# Patient Record
Sex: Female | Born: 1980 | Race: White | Hispanic: No | Marital: Married | Smoking: Never smoker
Health system: Southern US, Community
[De-identification: ages and names within clinical notes are randomized; demographics above are authoritative.]

## PROBLEM LIST (undated history)

## (undated) DIAGNOSIS — R000 Tachycardia, unspecified: Secondary | ICD-10-CM

## (undated) DIAGNOSIS — I499 Cardiac arrhythmia, unspecified: Secondary | ICD-10-CM

## (undated) HISTORY — PX: CERVICAL CONE BIOPSY: SUR198

## (undated) HISTORY — DX: Tachycardia, unspecified: R00.0

---

## 2011-01-26 ENCOUNTER — Encounter: Payer: Self-pay | Admitting: Cardiovascular Disease

## 2011-02-05 ENCOUNTER — Ambulatory Visit (INDEPENDENT_AMBULATORY_CARE_PROVIDER_SITE_OTHER): Payer: No Typology Code available for payment source | Admitting: Cardiovascular Disease

## 2011-02-05 ENCOUNTER — Encounter: Payer: Self-pay | Admitting: Cardiovascular Disease

## 2011-02-05 VITALS — BP 104/60 | HR 109 | Ht 61.0 in | Wt 123.8 lb

## 2011-02-05 DIAGNOSIS — R000 Tachycardia, unspecified: Secondary | ICD-10-CM

## 2011-02-05 MED ORDER — METOPROLOL TARTRATE 25 MG PO TABS: 25.0000 mg | ORAL_TABLET | Freq: Two times a day (BID) | ORAL | Status: DC | PRN

## 2011-02-05 NOTE — Patient Instructions (Signed)
Start Metoprolol Tartrate 25mg  twice daily. You may want to start with 1/2 tablet, even in just the AM. If unable to take Metoprolol, you can try Bystolic 5mg . Your physician has requested that you have an echocardiogram. Echocardiography is a painless test that uses sound waves to create images of your heart. It provides your doctor with information about the size and shape of your heart and how well your heart's chambers and valves are working. This procedure takes approximately one hour. There are no restrictions for this procedure. Your appt for this procedure will be at [facility]:  Your physician recommends that you schedule a follow-up appointment in:

## 2011-02-05 NOTE — Assessment & Plan Note (Signed)
Etiology of her tachycardia is uncertain. It certainly could have been ongoing for a long period of time as we do not have access to old OB/GYN records at this time. Thyroid is within normal limits.  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 This will likely also be important in light of the fact that she would like to get pregnant.  We did discuss various treatment options including metoprolol tartrate or other beta blockers such as bystolic. She will try low-dose metoprolol tartrate 12.5 mg in the morning to start with possible titration upwards as tolerated. Her blood pressure is borderline low.  My concern with a long-standing tachycardia would be the risk of a tachycardia mediated cardiomyopathy. Beta blockers would not be a good choice during pregnancy and if she was symptomatic, we would have to think of alternatives such as calcium channel blockers.  We will call her with the results of her echo have asked her to contact me in the next several weeks after she has had a chance to try metoprolol.

## 2011-02-05 NOTE — Progress Notes (Signed)
   Patient ID: Mary Blair, female    DOB: January 01, 1981, 30 y.o.   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 She is a patient of Dr. Andrey Spearman.  She reports that in general she feels well and does not have any significant symptoms from her fast heart beat. She is uncertain how long she has had an elevated heart rhythm. She does report periods of tachycardia/fluttering that have been on and off for quite some time though more episodes recently. 2 weeks ago, she had episodes of fluttering that seemed to wax and wane all day. Typically the episodes last less than one minute.  She does workout at the gym and it is not unusual for her to have a heart rate in the 170 range. She does have shortness of breath with climbing stairs. She denies any significant lower extremity edema, cough. She has no trouble sleeping at nighttime on a flat surface with one pillow.  EKG shows sinus tachycardia with rate of 109 beats per minute, T-wave abnormality in lead V3     Review of Systems  Constitutional: Negative.   HENT: Negative.   Eyes: Negative.   Respiratory: Negative.        SOB with exertion.  Cardiovascular: Negative.   Gastrointestinal: Negative.   Musculoskeletal: Negative.   Skin: Negative.   Neurological: Negative.   Hematological: Negative.   Psychiatric/Behavioral: Negative.   All other systems reviewed and are negative.    BP 104/60  Pulse 109  Ht 5\' 1"  (1.549 m)  Wt 123 lb 12.8 oz (56.155 kg)  BMI 23.39 kg/m2   Physical Exam  Nursing note and vitals reviewed. Constitutional: She is oriented to person, place, and time. She appears well-developed and well-nourished.  HENT:  Head: Normocephalic.  Nose: Nose normal.  Mouth/Throat: Oropharynx is clear and moist.  Eyes: Conjunctivae are normal. Pupils are equal, round, and reactive to light.  Neck: Normal range of motion.  Neck supple. No JVD present.  Cardiovascular: Regular rhythm, normal heart sounds and intact distal pulses.  Tachycardia present.  Exam reveals no gallop and no friction rub.   No murmur heard. Pulmonary/Chest: Effort normal and breath sounds normal. No respiratory distress. She has no wheezes. She has no rales. She exhibits no tenderness.  Abdominal: Soft. Bowel sounds are normal. She exhibits no distension. There is no tenderness.  Musculoskeletal: Normal range of motion. She exhibits no edema and no tenderness.  Lymphadenopathy:    She has no cervical adenopathy.  Neurological: She is alert and oriented to person, place, and time. Coordination normal.  Skin: Skin is warm and dry. No rash noted. No erythema.  Psychiatric: She has a normal mood and affect. Her behavior is normal. Judgment and thought content normal.         Assessment and Plan

## 2011-02-12 ENCOUNTER — Ambulatory Visit: Payer: Self-pay | Admitting: Cardiovascular Disease

## 2011-02-12 DIAGNOSIS — R0602 Shortness of breath: Secondary | ICD-10-CM

## 2011-10-12 ENCOUNTER — Encounter: Payer: Self-pay | Admitting: Maternal and Fetal Medicine

## 2012-03-16 ENCOUNTER — Observation Stay: Payer: Self-pay

## 2012-03-16 ENCOUNTER — Inpatient Hospital Stay: Payer: Self-pay | Admitting: Obstetrics and Gynecology

## 2012-03-17 LAB — CBC WITH DIFFERENTIAL/PLATELET
Basophil #: 0 10*3/uL (ref 0.0–0.1)
Eosinophil #: 0 10*3/uL (ref 0.0–0.7)
Eosinophil %: 0 %
HCT: 35.8 % (ref 35.0–47.0)
HGB: 12.3 g/dL (ref 12.0–16.0)
Lymphocyte %: 6.1 %
MCH: 31.4 pg (ref 26.0–34.0)
MCV: 91 fL (ref 80–100)
Monocyte %: 2.7 %
Neutrophil #: 17 10*3/uL — ABNORMAL HIGH (ref 1.4–6.5)
RBC: 3.92 10*6/uL (ref 3.80–5.20)
RDW: 12.7 % (ref 11.5–14.5)
WBC: 18.7 10*3/uL — ABNORMAL HIGH (ref 3.6–11.0)

## 2012-03-18 LAB — HEMATOCRIT: HCT: 30.2 % — ABNORMAL LOW (ref 35.0–47.0)

## 2013-04-21 ENCOUNTER — Emergency Department: Payer: Self-pay | Admitting: Emergency Medicine

## 2013-04-21 LAB — CBC
HCT: 38.5 % (ref 35.0–47.0)
HGB: 13.3 g/dL (ref 12.0–16.0)
MCH: 30.4 pg (ref 26.0–34.0)
MCHC: 34.6 g/dL (ref 32.0–36.0)
MCV: 88 fL (ref 80–100)
Platelet: 274 10*3/uL (ref 150–440)
WBC: 6.7 10*3/uL (ref 3.6–11.0)

## 2013-04-21 LAB — BASIC METABOLIC PANEL
Anion Gap: 5 — ABNORMAL LOW (ref 7–16)
Chloride: 110 mmol/L — ABNORMAL HIGH (ref 98–107)
Co2: 26 mmol/L (ref 21–32)
Creatinine: 0.68 mg/dL (ref 0.60–1.30)
Sodium: 141 mmol/L (ref 136–145)

## 2013-04-21 LAB — TSH: Thyroid Stimulating Horm: 1.13 u[IU]/mL

## 2013-04-21 LAB — TROPONIN I: Troponin-I: <0.02 ng/mL

## 2013-05-29 ENCOUNTER — Encounter: Payer: Self-pay | Admitting: Cardiovascular Disease

## 2013-05-29 ENCOUNTER — Ambulatory Visit (INDEPENDENT_AMBULATORY_CARE_PROVIDER_SITE_OTHER): Payer: 59 | Admitting: Cardiovascular Disease

## 2013-05-29 VITALS — BP 118/80 | HR 96 | Ht 61.0 in | Wt 124.2 lb

## 2013-05-29 DIAGNOSIS — R000 Tachycardia, unspecified: Secondary | ICD-10-CM

## 2013-05-29 DIAGNOSIS — R079 Chest pain, unspecified: Secondary | ICD-10-CM

## 2013-05-29 DIAGNOSIS — R002 Palpitations: Secondary | ICD-10-CM

## 2013-05-29 MED ORDER — METOPROLOL TARTRATE 25 MG PO TABS: 25.0000 mg | ORAL_TABLET | Freq: Two times a day (BID) | ORAL | Status: AC | PRN

## 2013-05-29 MED ORDER — PROPRANOLOL HCL 10 MG PO TABS: 10.0000 mg | ORAL_TABLET | Freq: Three times a day (TID) | ORAL | Status: AC | PRN

## 2013-05-29 NOTE — Assessment & Plan Note (Signed)
Have asked her to monitor her heart rate on a pulse meter on her phone. She may have short runs of atrial tachycardia or SVT. Symptoms are very short period she will continue on low-dose metoprolol twice a day. Could try propranolol when necessary for breakthrough palpitations

## 2013-05-29 NOTE — Assessment & Plan Note (Signed)
Recent episode of tachycardia with chest pain. EKG was normal, cardiac enzymes normal x1, normal TSH. No further episodes since that time. Rare low risk of underlying coronary artery disease. Otherwise has been active and exercising. No further testing at this time. If symptoms recur, could try calcium channel blockers such as diltiazem 30 mg when necessary for spasm, even short acting nitrates or nitroglycerin sublingual.

## 2013-05-29 NOTE — Patient Instructions (Addendum)
You are doing well. No medication changes were made.  Please take metoprolol twice a day with propranolol as needed for palpitations  Please call us if you have new issues that need to be addressed before your next appt.

## 2013-05-29 NOTE — Progress Notes (Signed)
   Patient ID: Mary Blair, female    DOB: 1981-07-20, 32 y.o.   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 She is a patient of Dr. Andrey Spearman.  Overall she has been doing well. Since her last clinic visit, she has had a child, now 14 months. She does report recent episodes of palpitations described as a rapid rhythm lasting for only a few seconds. Sometimes associated with chest tightness. She describes the palpitations as a fast flutter.  She has been taking metoprolol 12.5 mg twice a day on a regular basis.  She was seen by employee health PA and stent to the emergency room for EKG and blood work EKG was essentially normal with heart rate 101 beats per minute. CBC was normal, basic metabolic panel normal, cardiac troponin normal with TSH with 1.13  She continues to workout at the gym. No significant chest pain with exertion. Good exercise tolerance. If she takes large amounts of metoprolol, feels more tired.  EKG shows sinus tachycardia with rate of 96 beats per minute, no significant ST or T wave changes   Outpatient Encounter Prescriptions as of 05/29/2013  Medication Sig Dispense Refill  . metoprolol tartrate (LOPRESSOR) 25 MG tablet Take 12.5 mg by mouth 2 (two) times daily as needed.        Review of Systems  Constitutional: Negative.   HENT: Negative.   Eyes: Negative.   Respiratory: Negative.   Cardiovascular: Positive for chest pain and palpitations.  Gastrointestinal: Negative.   Musculoskeletal: Negative.   Skin: Negative.   Neurological: Negative.   Psychiatric/Behavioral: Negative.   All other systems reviewed and are negative.    BP 118/80  Pulse 96  Ht 5\' 1"  (1.549 m)  Wt 124 lb 4 oz (56.359 kg)  BMI 23.49 kg/m2  Physical Exam  Nursing note and vitals reviewed. Constitutional: She is oriented to person, place, and time. She appears  well-developed and well-nourished.  HENT:  Head: Normocephalic.  Nose: Nose normal.  Mouth/Throat: Oropharynx is clear and moist.  Eyes: Conjunctivae are normal. Pupils are equal, round, and reactive to light.  Neck: Normal range of motion. Neck supple. No JVD present.  Cardiovascular: Normal rate, regular rhythm, S1 normal, S2 normal, normal heart sounds and intact distal pulses.  Exam reveals no gallop and no friction rub.   No murmur heard. Pulmonary/Chest: Effort normal and breath sounds normal. No respiratory distress. She has no wheezes. She has no rales. She exhibits no tenderness.  Abdominal: Soft. Bowel sounds are normal. She exhibits no distension. There is no tenderness.  Musculoskeletal: Normal range of motion. She exhibits no edema and no tenderness.  Lymphadenopathy:    She has no cervical adenopathy.  Neurological: She is alert and oriented to person, place, and time. Coordination normal.  Skin: Skin is warm and dry. No rash noted. No erythema.  Psychiatric: She has a normal mood and affect. Her behavior is normal. Judgment and thought content normal.    Assessment and Plan

## 2013-06-14 ENCOUNTER — Encounter: Payer: Self-pay | Admitting: Cardiovascular Disease

## 2013-08-31 ENCOUNTER — Other Ambulatory Visit: Payer: Self-pay

## 2014-01-04 ENCOUNTER — Ambulatory Visit: Payer: Self-pay | Admitting: Gynecologic Oncology

## 2014-01-11 ENCOUNTER — Ambulatory Visit: Payer: Self-pay | Admitting: Obstetrics and Gynecology

## 2014-01-11 LAB — BASIC METABOLIC PANEL
ANION GAP: 4 — AB (ref 7–16)
BUN: 11 mg/dL (ref 7–18)
CREATININE: 0.82 mg/dL (ref 0.60–1.30)
Calcium, Total: 9.2 mg/dL (ref 8.5–10.1)
Chloride: 104 mmol/L (ref 98–107)
Co2: 29 mmol/L (ref 21–32)
EGFR (Non-African Amer.): >60
Glucose: 90 mg/dL (ref 65–99)
Osmolality: 273 (ref 275–301)
POTASSIUM: 3.6 mmol/L (ref 3.5–5.1)
SODIUM: 137 mmol/L (ref 136–145)

## 2014-01-11 LAB — PREGNANCY, URINE: PREGNANCY TEST, URINE: NEGATIVE m[IU]/mL

## 2014-01-11 LAB — HEMOGLOBIN: HGB: 13.4 g/dL (ref 12.0–16.0)

## 2014-01-22 ENCOUNTER — Ambulatory Visit: Payer: Self-pay | Admitting: Obstetrics and Gynecology

## 2014-01-24 LAB — PATHOLOGY REPORT

## 2014-01-26 ENCOUNTER — Ambulatory Visit: Payer: Self-pay

## 2014-01-29 ENCOUNTER — Ambulatory Visit: Payer: Self-pay | Admitting: Gynecologic Oncology

## 2014-02-23 ENCOUNTER — Ambulatory Visit: Payer: Self-pay | Admitting: Gynecologic Oncology

## 2014-08-02 ENCOUNTER — Ambulatory Visit: Payer: Self-pay | Admitting: Otolaryngology

## 2015-02-16 NOTE — Op Note (Signed)
PATIENT NAME:  Mary Blair, Mary Blair MR#:  [id number] DATE OF BIRTH:  Apr 30, 1981  DATE OF PROCEDURE:  01/22/2014  PREOPERATIVE DIAGNOSIS: Cervical adenocarcinoma in situ.   POSTOPERATIVE DIAGNOSIS: Cervical adenocarcinoma in situ.   PROCEDURES:  1.  Cervical conization. 2.  Endocervical curettage.   SURGEON: Jennell Cornerhomas Cena Bruhn, M.D.   ANESTHESIA: LMA.   INDICATION: A 34 year old gravida 1, para 1. A patient with Pap smear dated 11/27/2013 that showed atypical glandular cells of undetermined significance. The patient underwent a subsequent cervical biopsy on 12/25/2013 that showed adenocarcinoma in situ.   DESCRIPTION OF PROCEDURE: After adequate LMA anesthesia, the patient was placed in the dorsal supine position with the legs in the candy-cane stirrups. The patient's abdomen, perineum, and vagina were prepped and draped in normal sterile fashion. Bladder was catheterized and yielded 75 mL clear urine. The patient did receive 1 gram IV Ancef prior to commencement of the case. The cervix was circumferentially injected with 1% lidocaine with epinephrine. Stay sutures were placed at the 3 and 9 o'clock position on the cervix to ligate the cervical branch of the cervical arteries. The conization then was performed with #11 blade scalpel incorporating the transformation zone and tunneling down to get the rest of our specimen. Endocervical curettage was performed above this. Good hemostasis was noted. Rollerball was used for coagulation. A stringent was placed into the endocervical canal followed by Gelfoam placed centrally, and the stay sutures were plicated essentially to add additional pressure. Good hemostasis noted. There were no complications. The specimen was brought out in 2 pieces. The main portion was tagged at 12 o'clock and there was a fragment that was detached approximately at 7 o'clock position. This was sent separately as well. Intraoperative fluids 800 mL. Estimated blood loss minimal. The  patient was taken to the recovery room in good condition. ____________________________ Suzy Bouchardhomas J. Taelyr Jantz, MD tjs:sb D: 01/22/2014 13:02:20 ET T: 01/22/2014 13:15:49 ET JOB#: [id number]  cc: Suzy Bouchardhomas J. Alexzavier Girardin, MD, <Dictator> Suzy BouchardHOMAS J Aianna Fahs MD ELECTRONICALLY SIGNED 01/26/2014 9:51

## 2015-03-05 NOTE — H&P (Signed)
L&D Evaluation:  History:   HPI [age] yo GH1P0 with LMP of 07/16/11 & EDD of 04/22/12 here with "UC's becoming stronger after being here earlier and sent home". NO LOF since leaving the hospital but, had clear fluid earlier that was neg for Nitrazine. Pt was given 1 dose of Terb and UC's calmed down and sent home to rest. Shortly after arrival home, pt began having more intense UC's, vomiting and diarrhea. She is now 5/100/.vtx.    Presents with contractions    Patient's Medical History 1 abnormal Pap as a teen    Patient's Surgical History none    Medications Pre Natal Vitamins    Allergies NKDA    Social History none    Family History Non-Contributory   Exam:   Vital Signs stable    General no apparent distress    Mental Status clear    Chest clear    Heart normal sinus rhythm    Abdomen gravid, tender with contractions    Estimated Fetal Weight Average for gestational age    Back no CVAT    Edema 1+    Reflexes 1+    Clonus negative    Pelvic +PPROM    Mebranes Ruptured    Description clear    FHT normal rate with no decels    Ucx regular    Skin dry    Lymph no lymphadenopathy   Impression:   Impression active labor, IUP at 34 5/7 weeks in active labor   Plan:   Plan monitor contractions and for cervical change   Electronic Signatures: Sharee PimpleJones, Caron W (CNM)  (Signed 22-May-13 22:44)  Authored: L&D Evaluation   Last Updated: 22-May-13 22:44 by Sharee PimpleJones, Caron W (CNM)

## 2015-03-05 NOTE — H&P (Signed)
L&D Evaluation:  History:   HPI 34 yo G1P0 with LMP of 07/16/11 & EDD 04/22/12 with "LOF at 1600 and UC' becoming uncomfortable in lower back, No VB, no decreased FM. Pt works in [employer], [employer] and came upstairs. [facility] for low risk OB and 1 abnormal pap as a teen.    Presents with back pain, leaking fluid    Patient's Medical History 1 abnormal pap as a teen    Patient's Surgical History none    Medications Pre Natal Vitamins    Allergies NKDA    Social History none    Family History Non-Contributory   ROS:   ROS All systems were reviewed.  HEENT, CNS, GI, GU, Respiratory, CV, Renal and Musculoskeletal systems were found to be normal.   Exam:   Vital Signs stable    General no apparent distress    Mental Status clear    Chest clear    Heart normal sinus rhythm    Abdomen gravid, non-tender    Estimated Fetal Weight Average for gestational age    Back no CVAT    Edema 1+    Reflexes 1+    Clonus negative    Pelvic cervix closed and thick    Mebranes Intact    FHT normal rate with no decels, Accels seen, reactive NST, occas variable    Ucx regular, q 1-2 mins, lasts 45 secs    Skin dry    Lymph no lymphadenopathy   Impression:   Impression IUP at 34 5/7 weeks with  Threatened  PTL and ROM   Plan:   Plan UA, monitor contractions and for cervical change, Terb x 1 dose   Electronic Signatures: Sharee PimpleJones, Caron W (CNM)  (Signed 22-May-13 17:32)  Authored: L&D Evaluation   Last Updated: 22-May-13 17:32 by Sharee PimpleJones, Caron W (CNM)

## 2015-04-09 NOTE — H&P (Signed)
  Expand All Collapse All    Chief Complaint:    Mary Blair is a 34 y.o G2P1 at [redacted] weeks EGA with u/s cw missed abortion at [redacted] week ega. Blood type A+ Pt elects for suction D+C .  Past Medical History:  has a past medical history of Tachycardia; Adenocarcinoma in situ of cervix; and Atypical glandular cells of undetermined significance (AGUS) on cervical Pap smear.  Past Surgical History:  has past surgical history that includes cold knife cone (01/22/2014); Cervical biopsy (12/25/2013); and Cervical biopsy and endometrial biopsy (12/25/2013). Family History: family history includes Cervical cancer in her paternal aunt; Coronary artery disease in her maternal grandmother and paternal grandfather; Diabetes mellitus in her paternal grandfather; Hyperlipidemia in her father and mother; Lung cancer in her paternal grandfather; Osteopenia in her mother; Ovarian cancer in her paternal aunt and paternal aunt; Parkinsonism in her paternal grandfather. There is no history of Breast cancer. Social History:  reports that she has never smoked. She does not have any smokeless tobacco history on file. She reports that she does not drink alcohol or use illicit drugs. OB/GYN History:  OB History    Gravida Para Term Preterm AB TAB SAB Ectopic Multiple Living   1 1 1       1       Allergies: has No Known Allergies. Medications: Current outpatient prescriptions: drospirenone-ethinyl estradiol (YASMIN) 3-0.03 mg tablet, Take 1 tablet by mouth once daily., Disp: , Rfl: ; metoprolol succinate (TOPROL-XL) 25 MG XL tablet, Take 25 mg by mouth 2 (two) times daily., Disp: , Rfl:   Review of Systems: General: No fatigue or weight loss Eyes:No vision changes Ears:No hearing difficulty Respiratory: No cough or shortness of breath Pulmonary: No asthma or shortness of  breath Cardiovascular: No chest pain, palpitations, dyspnea on exertion Gastrointestinal: No abdominal bloating, chronic diarrhea, constipations, masses, pain or hematochezia Genitourinary:No hematuria, dysuria, abnormal vaginal discharge, pelvic pain, Menometrorrhagia Lymphatic:No swollen lymph nodes Musculoskeletal:No muscle weakness Neurologic:No extremity weakness, syncope, seizure disorder Psychiatric:No history of depression, delusions or suicidal/homicidal ideation   Exam:   Filed Vitals:   08/07/14 1547  BP: 114/86  Pulse: 100    Weight 60 kg   WDWN white/female in NAD  Lung CTA  CV RRR without murmur Abdomen: soft , no mass, normal active bowel sounds, non-tender, no rebound tenderness Pelvic: tanner stage 5 ,  External genitalia: vulva /labia no lesions Urethra: no prolapse Vagina: normal physiologic d/c Cervix: , no cervical motion tenderness  Uterus:9 week Adnexa: no mass, non-tender   Impression:   Missed AB    Plan:   Suction D+C .  Pt has been counseled regarding the options and risks . All questions answered .

## 2015-04-10 ENCOUNTER — Encounter
Admission: RE | Admit: 2015-04-10 | Discharge: 2015-04-10 | Disposition: A | Discharge status: Home / Self Care | Payer: 59 | Source: Ambulatory Visit | Attending: Obstetrics and Gynecology | Admitting: Obstetrics and Gynecology

## 2015-04-10 DIAGNOSIS — Z9889 Other specified postprocedural states: Secondary | ICD-10-CM | POA: Diagnosis not present

## 2015-04-10 DIAGNOSIS — Z3A08 8 weeks gestation of pregnancy: Secondary | ICD-10-CM | POA: Diagnosis not present

## 2015-04-10 DIAGNOSIS — Z793 Long term (current) use of hormonal contraceptives: Secondary | ICD-10-CM | POA: Diagnosis not present

## 2015-04-10 DIAGNOSIS — Z8541 Personal history of malignant neoplasm of cervix uteri: Secondary | ICD-10-CM | POA: Diagnosis not present

## 2015-04-10 DIAGNOSIS — Z8489 Family history of other specified conditions: Secondary | ICD-10-CM | POA: Diagnosis not present

## 2015-04-10 DIAGNOSIS — Z820 Family history of epilepsy and other diseases of the nervous system: Secondary | ICD-10-CM | POA: Diagnosis not present

## 2015-04-10 DIAGNOSIS — Z8049 Family history of malignant neoplasm of other genital organs: Secondary | ICD-10-CM | POA: Diagnosis not present

## 2015-04-10 DIAGNOSIS — Z8249 Family history of ischemic heart disease and other diseases of the circulatory system: Secondary | ICD-10-CM | POA: Diagnosis not present

## 2015-04-10 DIAGNOSIS — Z79899 Other long term (current) drug therapy: Secondary | ICD-10-CM | POA: Diagnosis not present

## 2015-04-10 DIAGNOSIS — O021 Missed abortion: Secondary | ICD-10-CM | POA: Diagnosis not present

## 2015-04-10 DIAGNOSIS — Z833 Family history of diabetes mellitus: Secondary | ICD-10-CM | POA: Diagnosis not present

## 2015-04-10 DIAGNOSIS — Z801 Family history of malignant neoplasm of trachea, bronchus and lung: Secondary | ICD-10-CM | POA: Diagnosis not present

## 2015-04-10 HISTORY — DX: Cardiac arrhythmia, unspecified: I49.9

## 2015-04-10 LAB — TYPE AND SCREEN
ABO/RH(D): A POS
Antibody Screen: NEGATIVE

## 2015-04-10 LAB — BASIC METABOLIC PANEL
Anion gap: 6 (ref 5–15)
BUN: 9 mg/dL (ref 6–20)
CALCIUM: 9.3 mg/dL (ref 8.9–10.3)
CO2: 25 mmol/L (ref 22–32)
Chloride: 106 mmol/L (ref 101–111)
Creatinine, Ser: 0.56 mg/dL (ref 0.44–1.00)
GFR calc Af Amer: >60 mL/min (ref >60)
GLUCOSE: 101 mg/dL — AB (ref 65–99)
Potassium: 4 mmol/L (ref 3.5–5.1)
Sodium: 137 mmol/L (ref 135–145)

## 2015-04-10 LAB — CBC
HEMATOCRIT: 39.3 % (ref 35.0–47.0)
Hemoglobin: 13.1 g/dL (ref 12.0–16.0)
MCH: 30.6 pg (ref 26.0–34.0)
MCHC: 33.3 g/dL (ref 32.0–36.0)
MCV: 91.9 fL (ref 80.0–100.0)
Platelets: 279 10*3/uL (ref 150–440)
RBC: 4.27 MIL/uL (ref 3.80–5.20)
RDW: 12.8 % (ref 11.5–14.5)
WBC: 9.9 10*3/uL (ref 3.6–11.0)

## 2015-04-10 NOTE — Patient Instructions (Signed)
  Your procedure is scheduled on:04/12/15 Report to Day Surgery. To find out your arrival time please call [phone] between 1PM - 3PM on 04/11/15.  Remember: Instructions that are not followed completely may result in serious medical risk, up to and including death, or upon the discretion of your surgeon and anesthesiologist your surgery may need to be rescheduled.    _x___ 1. Do not eat food or drink liquids after midnight. No gum chewing or hard candies.     __x__ 2. No Alcohol for 24 hours before or after surgery.   ____ 3. Bring all medications with you on the day of surgery if instructed.    _x__ 4. Notify your doctor if there is any change in your medical condition     (cold, fever, infections).     Do not wear jewelry, make-up, hairpins, clips or nail polish.  Do not wear lotions, powders, or perfumes. You may wear deodorant.  Do not shave 48 hours prior to surgery. Men may shave face and neck.  Do not bring valuables to the hospital.    [facility] is not responsible for any belongings or valuables.               Contacts, dentures or bridgework may not be worn into surgery.  Leave your suitcase in the car. After surgery it may be brought to your room.  For patients admitted to the hospital, discharge time is determined by your                treatment team.   Patients discharged the day of surgery will not be allowed to drive home.   Please read over the following fact sheets that you were given:   Surgical Site Infection Prevention   ____ Take these medicines the morning of surgery with A SIP OF WATER:    1.   2.   3.   4.  5.  6.  ____ Fleet Enema (as directed)   ____ Use CHG Soap as directed  ____ Use inhalers on the day of surgery  ____ Stop metformin 2 days prior to surgery    ____ Take 1/2 of usual insulin dose the night before surgery and none on the morning of surgery.   ____ Stop Coumadin/Plavix/aspirin on ____ Stop Anti-inflammatories on  ____  Stop supplements until after surgery.    ____ Bring C-Pap to the hospital.

## 2015-04-10 NOTE — OR Nursing (Addendum)
Echo 06/14/13 Ekg 05/29/13 by Dr Mariah Milling     Metoprolol and propranolol for prn use. Info under chart review

## 2015-04-11 LAB — ABO/RH: ABO/RH(D): A POS

## 2015-04-12 ENCOUNTER — Ambulatory Visit
Admission: RE | Admit: 2015-04-12 | Discharge: 2015-04-12 | Disposition: A | Discharge status: Home / Self Care | Payer: 59 | Source: Ambulatory Visit | Attending: Obstetrics and Gynecology | Admitting: Obstetrics and Gynecology

## 2015-04-12 ENCOUNTER — Ambulatory Visit: Payer: 59 | Admitting: Anesthesiology

## 2015-04-12 ENCOUNTER — Encounter: Payer: Self-pay | Admitting: *Deleted

## 2015-04-12 ENCOUNTER — Encounter
Admission: RE | Disposition: A | Discharge status: Home / Self Care | Payer: Self-pay | Source: Ambulatory Visit | Attending: Obstetrics and Gynecology

## 2015-04-12 DIAGNOSIS — Z8249 Family history of ischemic heart disease and other diseases of the circulatory system: Secondary | ICD-10-CM | POA: Insufficient documentation

## 2015-04-12 DIAGNOSIS — Z9889 Other specified postprocedural states: Secondary | ICD-10-CM | POA: Insufficient documentation

## 2015-04-12 DIAGNOSIS — Z820 Family history of epilepsy and other diseases of the nervous system: Secondary | ICD-10-CM | POA: Insufficient documentation

## 2015-04-12 DIAGNOSIS — Z8049 Family history of malignant neoplasm of other genital organs: Secondary | ICD-10-CM | POA: Insufficient documentation

## 2015-04-12 DIAGNOSIS — Z793 Long term (current) use of hormonal contraceptives: Secondary | ICD-10-CM | POA: Insufficient documentation

## 2015-04-12 DIAGNOSIS — Z79899 Other long term (current) drug therapy: Secondary | ICD-10-CM | POA: Insufficient documentation

## 2015-04-12 DIAGNOSIS — Z801 Family history of malignant neoplasm of trachea, bronchus and lung: Secondary | ICD-10-CM | POA: Insufficient documentation

## 2015-04-12 DIAGNOSIS — Z8541 Personal history of malignant neoplasm of cervix uteri: Secondary | ICD-10-CM | POA: Insufficient documentation

## 2015-04-12 DIAGNOSIS — O021 Missed abortion: Secondary | ICD-10-CM | POA: Diagnosis not present

## 2015-04-12 DIAGNOSIS — Z3A08 8 weeks gestation of pregnancy: Secondary | ICD-10-CM | POA: Insufficient documentation

## 2015-04-12 DIAGNOSIS — Z833 Family history of diabetes mellitus: Secondary | ICD-10-CM | POA: Insufficient documentation

## 2015-04-12 DIAGNOSIS — Z8489 Family history of other specified conditions: Secondary | ICD-10-CM | POA: Insufficient documentation

## 2015-04-12 HISTORY — PX: DILATION AND EVACUATION: SHX1459

## 2015-04-12 SURGERY — DILATION AND EVACUATION, UTERUS
Anesthesia: General | Wound class: Clean Contaminated

## 2015-04-12 MED ORDER — FENTANYL CITRATE (PF) 100 MCG/2ML IJ SOLN: 25.0000 ug | INTRAMUSCULAR | Status: DC | PRN

## 2015-04-12 MED ORDER — PHENYLEPHRINE HCL 10 MG/ML IJ SOLN
INTRAMUSCULAR | Status: DC | PRN
  Administered 2015-04-12: 100 ug via INTRAVENOUS

## 2015-04-12 MED ORDER — SILVER NITRATE-POT NITRATE 75-25 % EX MISC
CUTANEOUS | Status: AC
  Filled 2015-04-12: qty 5

## 2015-04-12 MED ORDER — ONDANSETRON HCL 4 MG/2ML IJ SOLN: 4.0000 mg | Freq: Once | INTRAMUSCULAR | Status: DC | PRN

## 2015-04-12 MED ORDER — SILVER NITRATE-POT NITRATE 75-25 % EX MISC
CUTANEOUS | Status: DC | PRN
  Administered 2015-04-12: 2

## 2015-04-12 MED ORDER — ONDANSETRON HCL 4 MG/2ML IJ SOLN
INTRAMUSCULAR | Status: DC | PRN
  Administered 2015-04-12: 4 mg via INTRAVENOUS

## 2015-04-12 MED ORDER — FAMOTIDINE 20 MG PO TABS
ORAL_TABLET | ORAL | Status: AC
  Administered 2015-04-12: 20 mg via ORAL
  Filled 2015-04-12: qty 1

## 2015-04-12 MED ORDER — LACTATED RINGERS IV SOLN
INTRAVENOUS | Status: DC
  Administered 2015-04-12: 11:00:00 via INTRAVENOUS

## 2015-04-12 MED ORDER — FAMOTIDINE 20 MG PO TABS
20.0000 mg | ORAL_TABLET | Freq: Once | ORAL | Status: AC
  Administered 2015-04-12: 20 mg via ORAL

## 2015-04-12 MED ORDER — LACTATED RINGERS IV SOLN
INTRAVENOUS | Status: DC
  Administered 2015-04-12: 12:00:00 via INTRAVENOUS

## 2015-04-12 SURGICAL SUPPLY — 20 items
CATH ROBINSON RED A/P 16FR (CATHETERS) ×3 IMPLANT
FILTER UTR ASPR SPEC (MISCELLANEOUS) ×1 IMPLANT
FLTR UTR ASPR SPEC (MISCELLANEOUS) ×3
GLOVE BIO SURGEON STRL SZ8 (GLOVE) ×3 IMPLANT
GOWN STRL REUS W/ TWL LRG LVL3 (GOWN DISPOSABLE) ×1 IMPLANT
GOWN STRL REUS W/ TWL XL LVL3 (GOWN DISPOSABLE) ×1 IMPLANT
GOWN STRL REUS W/TWL LRG LVL3 (GOWN DISPOSABLE) ×2
GOWN STRL REUS W/TWL XL LVL3 (GOWN DISPOSABLE) ×2
KIT RM TURNOVER CYSTO AR (KITS) ×3 IMPLANT
PACK DNC HYST (MISCELLANEOUS) ×3 IMPLANT
PAD OB MATERNITY 4.3X12.25 (PERSONAL CARE ITEMS) ×3 IMPLANT
PAD PREP 24X41 OB/GYN DISP (PERSONAL CARE ITEMS) ×3 IMPLANT
SET BERKELEY SUCTION TUBING (SUCTIONS) ×3 IMPLANT
TOWEL OR 17X26 4PK STRL BLUE (TOWEL DISPOSABLE) ×3 IMPLANT
VACURETTE 10 RIGID CVD (CANNULA) ×3 IMPLANT
VACURETTE 6 ASPIR F TIP BERK (CANNULA) ×3 IMPLANT
VACURETTE 7MM F TIP (CANNULA) ×2
VACURETTE 7MM F TIP STRL (CANNULA) ×1 IMPLANT
VACURETTE 8 RIGID CVD (CANNULA) ×3 IMPLANT
VACURETTE 8MM F TIP (MISCELLANEOUS) ×3 IMPLANT

## 2015-04-12 NOTE — Discharge Instructions (Signed)
Dilation and Curettage or Vacuum Curettage, Care After  These instructions give you information on caring for yourself after your procedure. Your doctor may also give you more specific instructions. Call your doctor if you have any problems or questions after your procedure.  HOME CARE  Do not drive, drink alcohol or make legal decisions for 24 hours.  Wait 1 week before doing any activities that wear you out.  Take your temperature 2 times a day for 4 days. Write it down. Tell your doctor if you have a fever.  Do not stand for a long time.  Do not lift, push, or pull anything over 10 pounds (4.5 kilograms).  Limit stair climbing to once or twice a day.  Rest often.  Continue with your usual diet.  Drink enough fluids to keep your pee (urine) clear or pale yellow.  If you have a hard time pooping (constipation), you may:  Take a medicine to help you go poop (laxative) as told by your doctor.  Eat more fruit and bran.  Drink more fluids.  Take showers, not baths, for as long as told by your doctor.  Do not swim or use a hot tub until your doctor says it is okay.  Have someone with you for 1-2 days after the procedure.  Do not douche, use tampons, or have sex (intercourse) for 2 weeks.  Only take medicines as told by your doctor. Do not take aspirin. It can cause bleeding.  Keep all doctor visits. GET HELP IF:  You have cramps or pain not helped by medicine.  You have new pain in the belly (abdomen).  You have a bad smelling fluid coming from your vagina.  You have a rash.  You have problems with any medicine. GET HELP RIGHT AWAY IF:   You start to bleed more than a regular period.  You have a fever.  You have chest pain.  You have trouble breathing.  You feel dizzy or feel like passing out (fainting).  You pass out.  You have pain in the tops of your shoulders.  You have vaginal bleeding with or without clumps of blood (blood clots). MAKE SURE  YOU:  Understand these instructions.  Will watch your condition.  Will get help right away if you are not doing well or get worse.  Document Released: 07/21/2008 Document Revised: 10/17/2013 Document Reviewed: 05/11/2013 [facility] Patient Information 2015 [place], [place]. This information is not intended to replace advice given to you by your health care provider. Make sure you discuss any questions you have with your health care provider.

## 2015-04-12 NOTE — Anesthesia Procedure Notes (Signed)
Procedure Name: LMA Insertion Date/Time: 04/12/2015 11:57 AM Performed by: Junious Silk Pre-anesthesia Checklist: Patient identified, Emergency Drugs available, Suction available, Patient being monitored and Timeout performed Patient Re-evaluated:Patient Re-evaluated prior to inductionOxygen Delivery Method: Circle system utilized Preoxygenation: Pre-oxygenation with 100% oxygen Intubation Type: IV induction Ventilation: Mask ventilation without difficulty LMA: LMA inserted LMA Size: 3.5 Number of attempts: 1 Tube secured with: Tape Dental Injury: Teeth and Oropharynx as per pre-operative assessment  Difficulty Due To: Difficulty was unanticipated

## 2015-04-12 NOTE — Progress Notes (Signed)
Pt interviewed today , no interval change . Ready for surgery

## 2015-04-12 NOTE — Brief Op Note (Signed)
04/12/2015  12:04 PM  PATIENT:  Mary Blair  34 y.o. female  PRE-OPERATIVE DIAGNOSIS:  MISSED ABORTION  POST-OPERATIVE DIAGNOSIS:  MISSED ABORTION  PROCEDURE:  Procedure(s): DILATATION AND EVACUATION (N/A)  SURGEON:  Surgeon(s) and Role:    Suzy Bouchard, MD - Primary  PHYSICIAN ASSISTANT:   ASSISTANTS: none   ANESTHESIA:   general  EBL:  Total I/O In: 300 [I.V.:300] Out: 250 [Urine:200; Blood:50]  BLOOD ADMINISTERED:none  DRAINS: none   LOCAL MEDICATIONS USED:  NONE  SPECIMEN:  Source of Specimen:  products of conception  DISPOSITION OF SPECIMEN:  PATHOLOGY  COUNTS:  YES  TOURNIQUET:  DICTATION: .verbal PLAN OF CARE: Discharge to home after PACU  PATIENT DISPOSITION:  Short Stay   Delay start of Pharmacological VTE agent (>24hrs) due to surgical blood loss or risk of bleeding: no

## 2015-04-12 NOTE — Transfer of Care (Signed)
Immediate Anesthesia Transfer of Care Note  Patient: Mary Blair  Procedure(s) Performed: Procedure(s): DILATATION AND EVACUATION (N/A)  Patient Location: PACU  Anesthesia Type:General  Level of Consciousness: sedated  Airway & Oxygen Therapy: Patient connected to face mask oxygen  Post-op Assessment: Report given to RN and Post -op Vital signs reviewed and stable  Post vital signs: Reviewed and stable  Last Vitals:  Filed Vitals:   04/12/15 1014  BP: 128/76  Temp: 36.9 C  Resp: 16    Complications: No apparent anesthesia complications

## 2015-04-12 NOTE — Anesthesia Postprocedure Evaluation (Signed)
  Anesthesia Post-op Note  Patient: Mary Blair  Procedure(s) Performed: Procedure(s): DILATATION AND EVACUATION (N/A)  Anesthesia type:General  Patient location: PACU  Post pain: Pain level controlled  Post assessment: Post-op Vital signs reviewed, Patient's Cardiovascular Status Stable, Respiratory Function Stable, Patent Airway and No signs of Nausea or vomiting  Post vital signs: Reviewed and stable  Last Vitals:  Filed Vitals:   04/12/15 1340  BP: 99/54  Pulse: 83  Temp:   Resp: 12    Level of consciousness: awake, alert  and patient cooperative  Complications: No apparent anesthesia complications

## 2015-04-12 NOTE — Anesthesia Preprocedure Evaluation (Signed)
Anesthesia Evaluation  Patient identified by MRN, date of birth, ID band Patient awake    Reviewed: Allergy & Precautions, NPO status , Patient's Chart, lab work & pertinent test results, reviewed documented beta blocker date and time   Airway Mallampati: II  TM Distance: >3 FB Neck ROM: Full    Dental no notable dental hx. (+) Caps Cap on left lower back molar:   Pulmonary neg pulmonary ROS,    Pulmonary exam normal       Cardiovascular Normal cardiovascular exam+ dysrhythmias Supra Ventricular Tachycardia Rhythm:Regular Rate:Normal     Neuro/Psych negative neurological ROS  negative psych ROS   GI/Hepatic negative GI ROS, Neg liver ROS,   Endo/Other  negative endocrine ROS  Renal/GU negative Renal ROS  negative genitourinary   Musculoskeletal negative musculoskeletal ROS (+)   Abdominal Normal abdominal exam  (+)   Peds negative pediatric ROS (+)  Hematology negative hematology ROS (+)   Anesthesia Other Findings   Reproductive/Obstetrics                             Anesthesia Physical Anesthesia Plan  ASA: II  Anesthesia Plan: General   Post-op Pain Management:    Induction: Intravenous  Airway Management Planned: LMA  Additional Equipment:   Intra-op Plan:   Post-operative Plan: Extubation in OR  Informed Consent: I have reviewed the patients History and Physical, chart, labs and discussed the procedure including the risks, benefits and alternatives for the proposed anesthesia with the patient or authorized representative who has indicated his/her understanding and acceptance.   Dental advisory given  Plan Discussed with: CRNA and Surgeon  Anesthesia Plan Comments:         Anesthesia Quick Evaluation

## 2015-04-13 NOTE — Op Note (Signed)
Mary Blair, Mary Blair            ACCOUNT NO.:  [id number]  MEDICAL RECORD NO.:  [id number]  LOCATION:  [facility]                         FACILITY:  [facility]  PHYSICIAN:  Jennell Corner, MDDATE OF BIRTH:  1981/05/14  DATE OF PROCEDURE:  04/12/2015 DATE OF DISCHARGE:  04/12/2015                              OPERATIVE REPORT   PREOPERATIVE DIAGNOSIS:  Missed abortion.  POSTOPERATIVE DIAGNOSIS:  Missed abortion.  PROCEDURE:  Suction, dilation and curettage.  ANESTHESIA:  General endotracheal anesthesia.  SURGEON:  Jennell Corner, MD  INDICATION:  This is a 34 year old gravida 2, para 1 patient at 57 weeks estimated gestational age and an office ultrasound shows fetus developed only to 8 weeks estimated gestational age.  RH status positive.  DESCRIPTION OF PROCEDURE:  After adequate general endotracheal anesthesia, the patient was placed in the dorsal supine position, legs in the [employer].  SCDs were previously placed.  The patient's lower abdomen, perineum, and vagina were prepped with Betadine and the patient was sterilely draped.  Time-out was performed.  The patient's bladder was straight catheterized, yielding 200 mL of urine.  A weighted speculum was placed in the posterior vaginal wall and the anterior cervix was grasped with a single-tooth tenaculum.  Cervix was dilated to #20 Hanks dilator without difficulty.  A #8 flexible suction curette was placed into the endometrial cavity and large amount of tissue consistent with products of conception was removed.  Sharp curettage was performed with good uterine cry throughout and a repeat suction curetting yielded no additional tissue.  Good hemostasis was noted.  Silver nitrate was applied to the anterior cervix where the tenaculum was holding the cervix.  The patient tolerated the procedure well.  Estimated blood loss, 50 mL.  Intraoperative fluids, 300 mL.  Urine output, 200 mL.  The patient was taken to the  recovery room in good condition.          ______________________________ Jennell Corner, MD     TS/MEDQ  D:  04/12/2015  T:  04/13/2015  Job:  [id number]

## 2015-04-15 LAB — SURGICAL PATHOLOGY

## 2015-11-07 DIAGNOSIS — D069 Carcinoma in situ of cervix, unspecified: Secondary | ICD-10-CM | POA: Diagnosis not present

## 2015-11-07 DIAGNOSIS — N921 Excessive and frequent menstruation with irregular cycle: Secondary | ICD-10-CM | POA: Diagnosis not present

## 2015-12-11 DIAGNOSIS — D069 Carcinoma in situ of cervix, unspecified: Secondary | ICD-10-CM | POA: Diagnosis not present

## 2015-12-11 DIAGNOSIS — R87612 Low grade squamous intraepithelial lesion on cytologic smear of cervix (LGSIL): Secondary | ICD-10-CM | POA: Diagnosis not present

## 2015-12-11 DIAGNOSIS — Z124 Encounter for screening for malignant neoplasm of cervix: Secondary | ICD-10-CM | POA: Diagnosis not present

## 2015-12-24 DIAGNOSIS — L298 Other pruritus: Secondary | ICD-10-CM | POA: Diagnosis not present

## 2015-12-24 DIAGNOSIS — B373 Candidiasis of vulva and vagina: Secondary | ICD-10-CM | POA: Diagnosis not present

## 2016-05-19 DIAGNOSIS — H5213 Myopia, bilateral: Secondary | ICD-10-CM | POA: Diagnosis not present

## 2016-07-14 IMAGING — MR MRI HEAD WITHOUT AND WITH CONTRAST
10 of 11 series · 35 of 48 positions shown · IV contrast (multihance)
Comparison: None.

CLINICAL DATA: 33-year-old female reports 2 months of unexplained
dizziness when walking, moving. Initial encounter.

EXAM:
MRI HEAD WITHOUT AND WITH CONTRAST
TECHNIQUE: Multiplanar, multiecho pulse sequences of the brain and surrounding
structures were obtained without and with intravenous contrast.
CONTRAST:  10 mm MultiHance.

[Series 2: T1 · sagittal · 5.0mm · 0.45mm/px · 5 of 25 slices shown (1 of 5)]
[im 1/25]
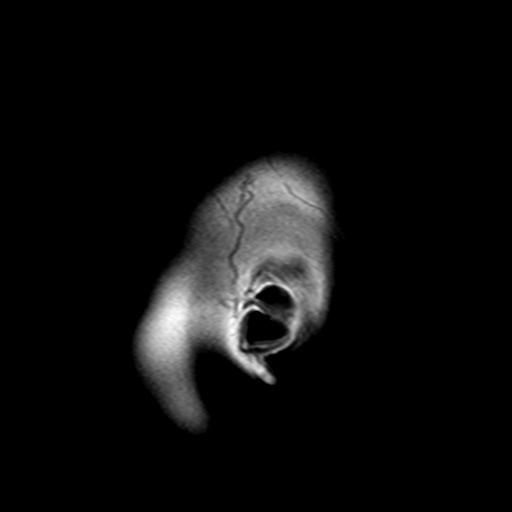
[im 7/25]
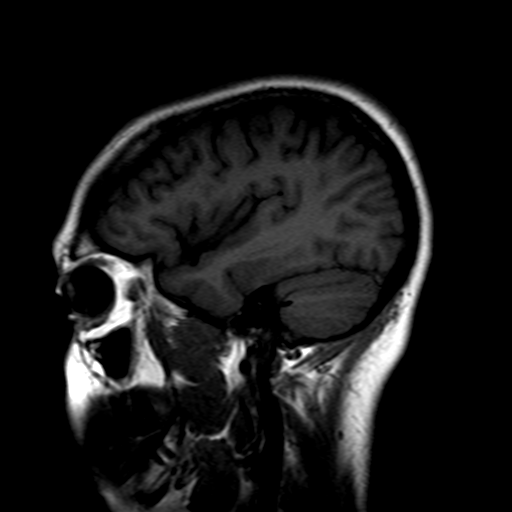
[im 13/25]
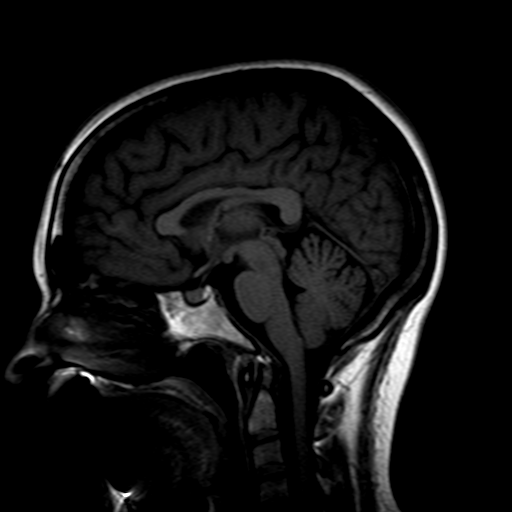
[im 19/25]
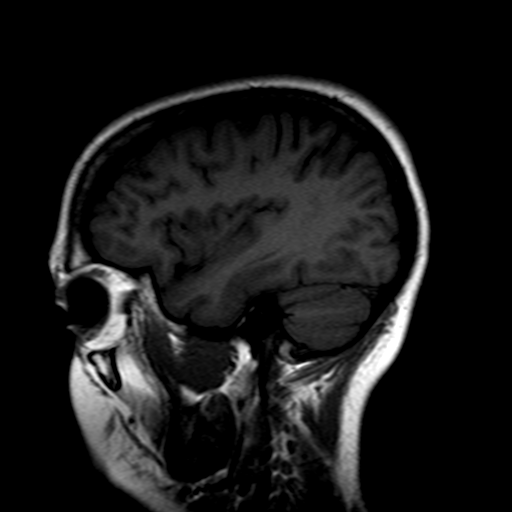
[im 25/25]
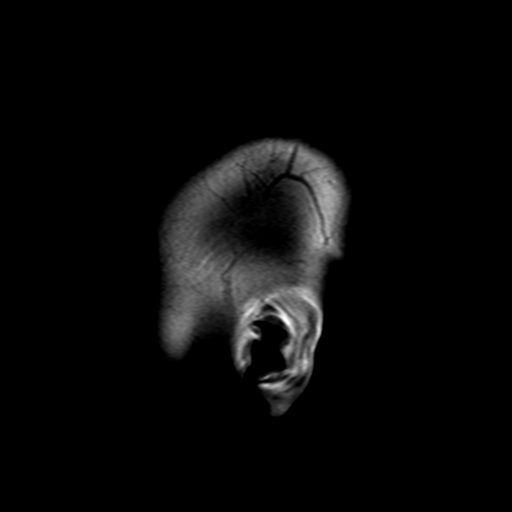

[Series 4: DWI · axial · 5.0mm · 1.80mm/px · z∈[-80,+60]mm · 4 of 23 slices shown]
[im 1/23]
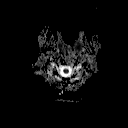
[im 8/23]
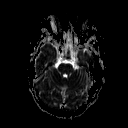
[im 15/23]
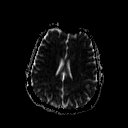
[im 23/23]
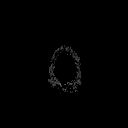

[Series 5: T2 · axial · 5.0mm · 0.45mm/px · z∈[-80,+60]mm · 4 of 23 slices shown]
[im 1/23]
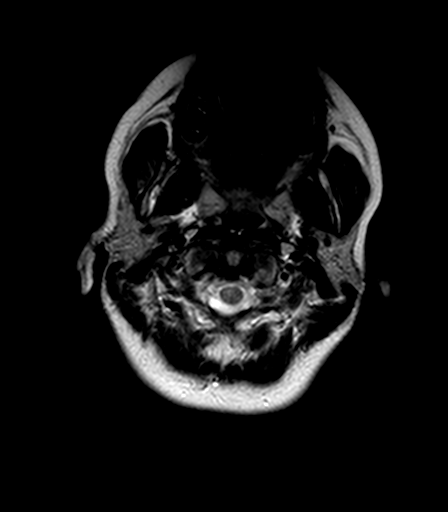
[im 8/23]
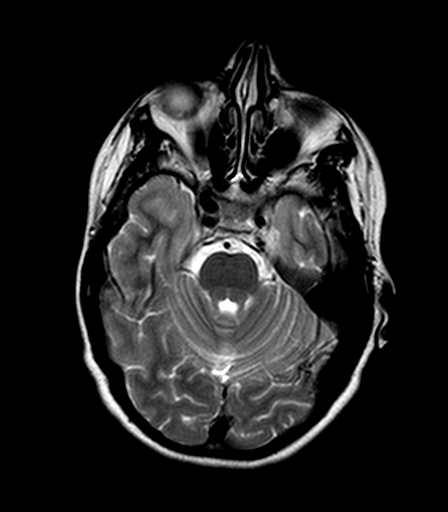
[im 15/23]
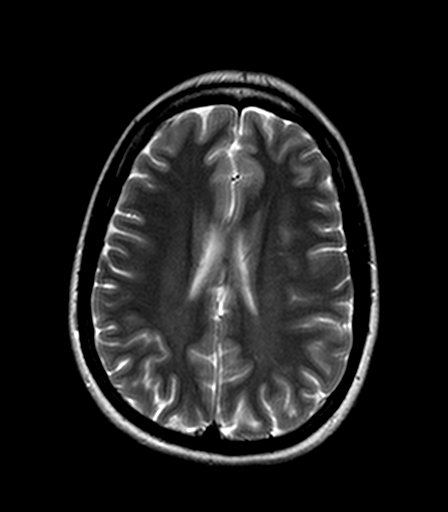
[im 23/23]
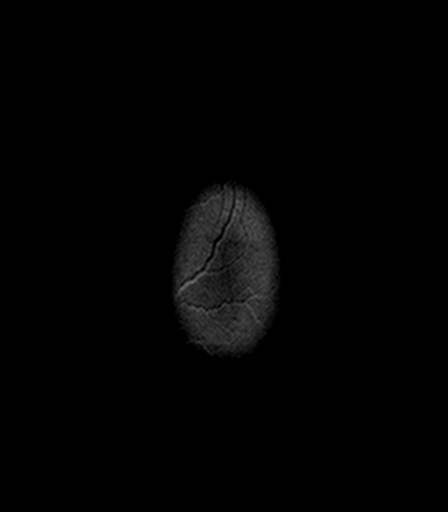

[Series 6: FLAIR · axial · 5.0mm · 0.90mm/px · z∈[-80,+60]mm · 4 of 23 slices shown]
[im 1/23]
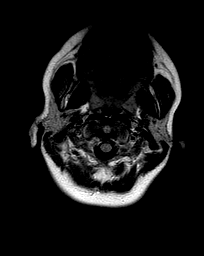
[im 8/23]
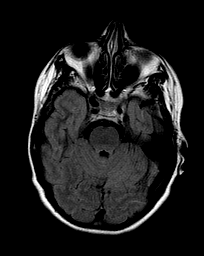
[im 15/23]
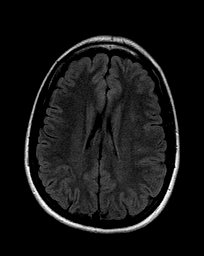
[im 23/23]
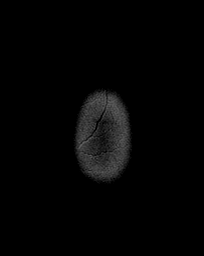

[Series 7: (id) · axial · 5.0mm · 1.80mm/px · 1 of 23 slices shown]
[im 1/23]
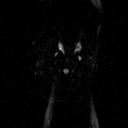

[Series 8: T1 · coronal · 3.0mm · 0.70mm/px · 2 of 11 slices shown (2 of 5)]
[im 1/11]
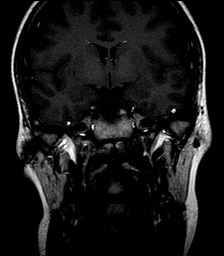
[im 11/11]
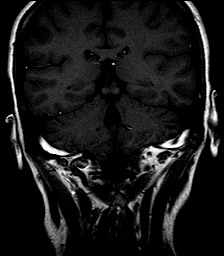

[Series 10: T1 · axial · 3.0mm · 0.70mm/px · z∈[-54,-25]mm · 2 of 11 slices shown (3 of 5)]
[im 1/11]
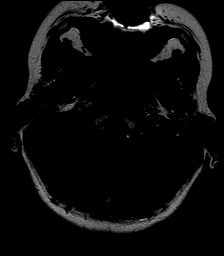
[im 11/11]
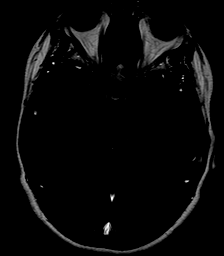

[Series 11: T1 · axial · 3.0mm · 0.70mm/px · z∈[-54,-25]mm · 2 of 11 slices shown (4 of 5)]
[im 1/11]
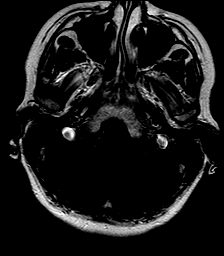
[im 11/11]
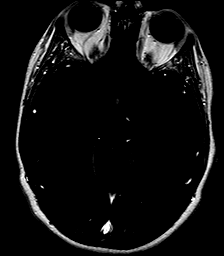

[Series 12: T1 · coronal · 3.0mm · 0.70mm/px · 2 of 11 slices shown (5 of 5)]
[im 1/11]
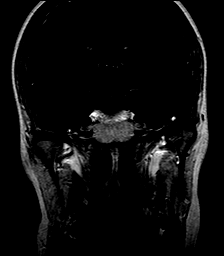
[im 11/11]
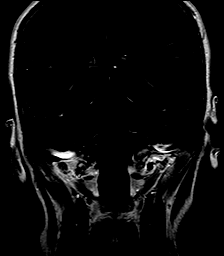

[Series 13: T1 post-contrast · axial · 3.0mm · 0.45mm/px · z∈[-84,+65]mm · 9 of 52 slices shown]
[im 1/52]
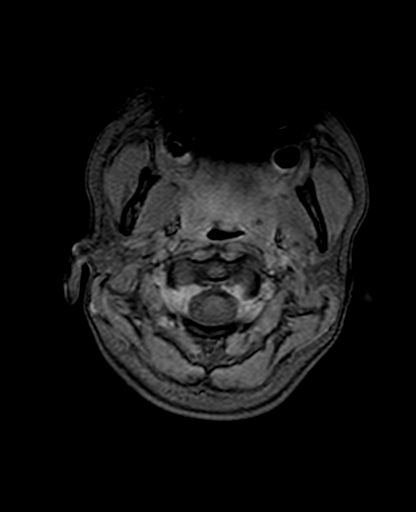
[im 7/52]
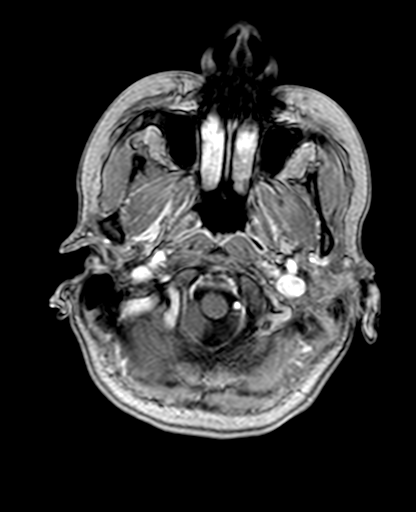
[im 13/52]
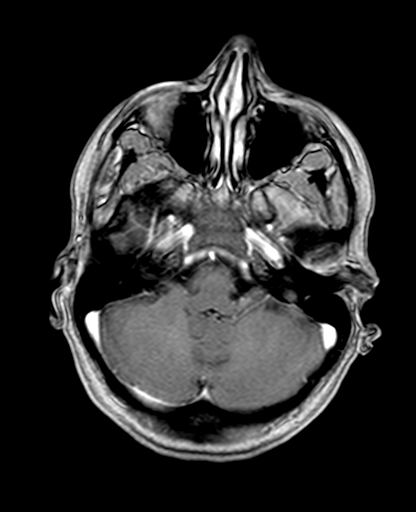
[im 20/52]
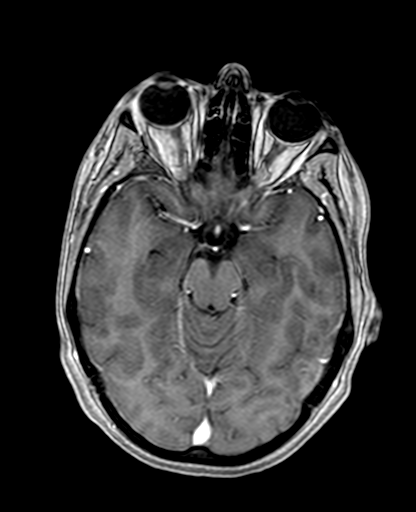
[im 26/52]
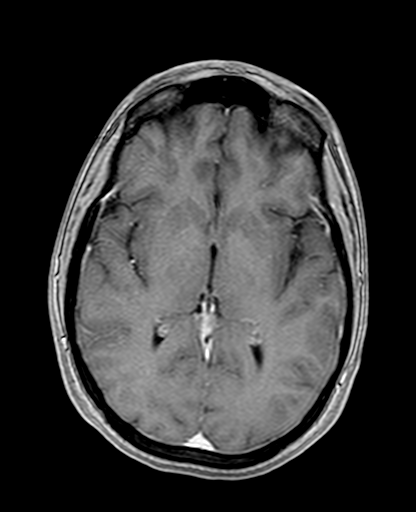
[im 32/52]
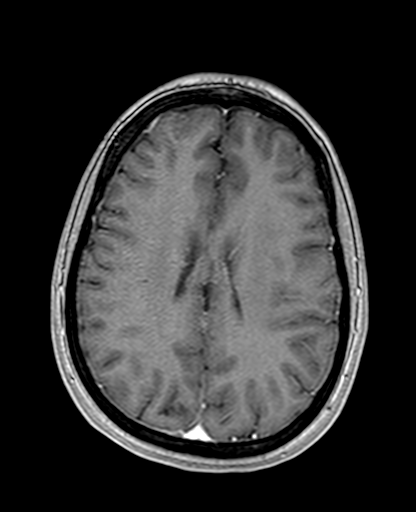
[im 39/52]
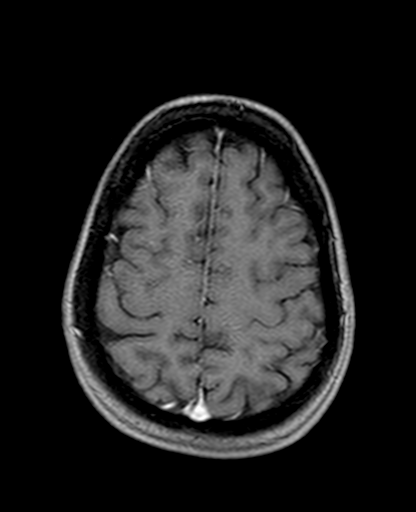
[im 45/52]
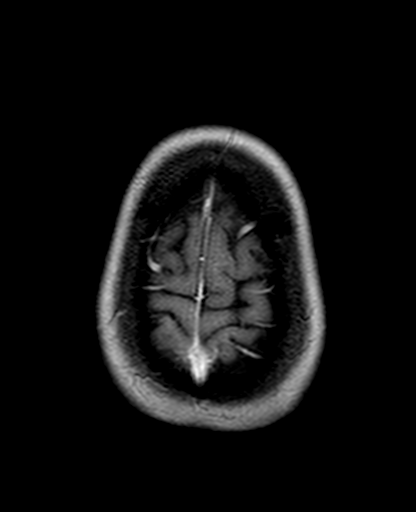
[im 52/52]
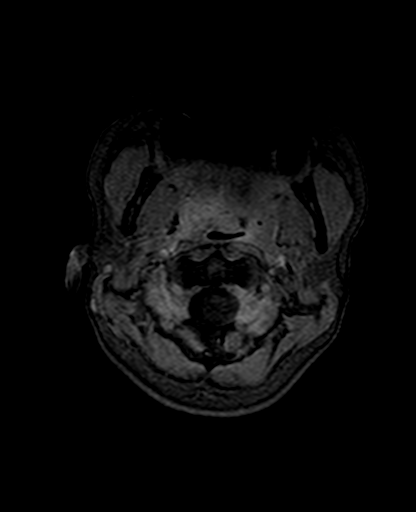

[35 of 48 positions shown; findings below may reference images not displayed]

FINDINGS: Cerebral volume is normal. No restricted diffusion to suggest acute
infarction. No midline shift, mass effect, evidence of mass lesion,
ventriculomegaly, extra-axial collection or acute intracranial
hemorrhage. Cervicomedullary junction and pituitary are within
normal limits. Negative visualized cervical spine. Major
intracranial vascular flow voids are preserved. Gray and white
matter signal is within normal limits throughout the brain. No
abnormal enhancement identified.

Dental hardware susceptibility artifact not significantly degrading
the study. Visualized orbit soft tissues are within normal limits.
Paranasal sinuses are clear. Normal bone marrow signal. Visualized
scalp soft tissues are within normal limits.

Dedicated internal auditory canal imaging. Normal cerebellopontine
angles. Normal bilateral cisternal and intracanalicular 7th and 8th
cranial nerves segments. Preserved T2 signal in the bilateral
cochlea and vestibular structures. Mastoids are clear. Normal
stylomastoid foramina. No abnormal enhancement identified. Normal
visualized parotid glands.
IMPRESSION: 1. Negative IAC imaging.
2.  Normal MRI appearance of the brain.
